# Patient Record
Sex: Female | Born: 1960 | Race: White | Hispanic: No | Marital: Married | Smoking: Never smoker
Health system: Southern US, Community
[De-identification: ages and names within clinical notes are randomized; demographics above are authoritative.]

---

## 1997-12-07 ENCOUNTER — Other Ambulatory Visit: Admission: RE | Admit: 1997-12-07 | Discharge: 1997-12-07 | Payer: Self-pay | Admitting: Obstetrics and Gynecology

## 2002-03-31 ENCOUNTER — Other Ambulatory Visit: Admission: RE | Admit: 2002-03-31 | Discharge: 2002-03-31 | Payer: Self-pay | Admitting: Obstetrics and Gynecology

## 2003-12-30 ENCOUNTER — Other Ambulatory Visit: Admission: RE | Admit: 2003-12-30 | Discharge: 2003-12-30 | Payer: Self-pay | Admitting: Obstetrics and Gynecology

## 2004-01-14 ENCOUNTER — Encounter: Admission: RE | Admit: 2004-01-14 | Discharge: 2004-01-14 | Payer: Self-pay | Admitting: Obstetrics and Gynecology

## 2006-01-04 ENCOUNTER — Encounter: Admission: RE | Admit: 2006-01-04 | Discharge: 2006-01-04 | Payer: Self-pay | Admitting: Obstetrics and Gynecology

## 2010-03-19 ENCOUNTER — Encounter: Payer: Self-pay | Admitting: Obstetrics and Gynecology

## 2013-09-03 NOTE — H&P (Signed)
Krista Oneill is an 53 y.o. female G2P2, husband S/P vasectomy, LMP @ 53 yo. C/O minor vaginal D/C. No vaginal bleeding. Exam in office noted 4 cm pedunculated mass prolapsing from cervix. Subsequent U/S noted thick EM stripe.   Pertinent Gynecological History: Menses: post-menopausal Bleeding: none Contraception: vasectomy DES exposure: denies Blood transfusions: none Sexually transmitted diseases: no past history Previous GYN Procedures: none  Last mammogram: Report pending Date: 2015 Last pap: report pending Date: 2015 OB History: G2, P2   Menstrual History: Menarche age: unknown  No LMP recorded.    No past medical history on file.  No past surgical history on file.  No family history on file.  Social History:  has no tobacco, alcohol, and drug history on file.  Allergies: Allergies not on file  No prescriptions prior to admission    Review of Systems  Constitutional: Negative for fever.    There were no vitals taken for this visit. Physical Exam  Cardiovascular: Normal rate and regular rhythm.   Respiratory: Effort normal and breath sounds normal.  GI: Soft. There is no tenderness.  Genitourinary:  4 cm pedunculated mass prolapsing from cervix Uterus 6 weeks size Adnexa without masses    No results found for this or any previous visit (from the past 24 hour(s)).  No results found.  Assessment/Plan: 53 yo G2P2 postmenopausal patient with prolapsed cervical mass and U/S suggestive of polyp in upper endometrial cavity H/S , D&C, Myosure resectoscope  Krista Oneill,Krista Oneill 09/03/2013, 5:47 PM

## 2013-09-11 ENCOUNTER — Encounter (HOSPITAL_COMMUNITY): Payer: Self-pay | Admitting: *Deleted

## 2013-09-17 ENCOUNTER — Encounter (HOSPITAL_COMMUNITY): Payer: Self-pay | Admitting: *Deleted

## 2013-09-17 ENCOUNTER — Ambulatory Visit (HOSPITAL_COMMUNITY)
Admission: RE | Admit: 2013-09-17 | Discharge: 2013-09-17 | Disposition: A | Discharge status: Home / Self Care | Payer: 59 | Source: Ambulatory Visit | Attending: Obstetrics and Gynecology | Admitting: Obstetrics and Gynecology

## 2013-09-17 ENCOUNTER — Encounter (HOSPITAL_COMMUNITY): Payer: 59 | Admitting: Anesthesiology

## 2013-09-17 ENCOUNTER — Ambulatory Visit (HOSPITAL_COMMUNITY): Payer: 59 | Admitting: Anesthesiology

## 2013-09-17 ENCOUNTER — Encounter (HOSPITAL_COMMUNITY)
Admission: RE | Disposition: A | Discharge status: Home / Self Care | Payer: Self-pay | Source: Ambulatory Visit | Attending: Obstetrics and Gynecology

## 2013-09-17 DIAGNOSIS — Z87891 Personal history of nicotine dependence: Secondary | ICD-10-CM | POA: Insufficient documentation

## 2013-09-17 DIAGNOSIS — N840 Polyp of corpus uteri: Secondary | ICD-10-CM | POA: Insufficient documentation

## 2013-09-17 DIAGNOSIS — Z780 Asymptomatic menopausal state: Secondary | ICD-10-CM | POA: Insufficient documentation

## 2013-09-17 DIAGNOSIS — N841 Polyp of cervix uteri: Secondary | ICD-10-CM | POA: Insufficient documentation

## 2013-09-17 DIAGNOSIS — R928 Other abnormal and inconclusive findings on diagnostic imaging of breast: Secondary | ICD-10-CM

## 2013-09-17 HISTORY — PX: DILATATION & CURETTAGE/HYSTEROSCOPY WITH TRUECLEAR: SHX6353

## 2013-09-17 LAB — CBC
HCT: 40.6 % (ref 36.0–46.0)
Hemoglobin: 13.6 g/dL (ref 12.0–15.0)
MCH: 28.2 pg (ref 26.0–34.0)
MCHC: 33.5 g/dL (ref 30.0–36.0)
MCV: 84.2 fL (ref 78.0–100.0)
Platelets: 302 10*3/uL (ref 150–400)
RBC: 4.82 MIL/uL (ref 3.87–5.11)
RDW: 13.3 % (ref 11.5–15.5)
WBC: 8.4 10*3/uL (ref 4.0–10.5)

## 2013-09-17 SURGERY — DILATATION & CURETTAGE/HYSTEROSCOPY WITH TRUCLEAR
Anesthesia: General | Site: Vagina

## 2013-09-17 MED ORDER — FENTANYL CITRATE 0.05 MG/ML IJ SOLN: 25.0000 ug | INTRAMUSCULAR | Status: DC | PRN

## 2013-09-17 MED ORDER — ONDANSETRON HCL 4 MG/2ML IJ SOLN
INTRAMUSCULAR | Status: AC
  Filled 2013-09-17: qty 2

## 2013-09-17 MED ORDER — LIDOCAINE HCL 1 % IJ SOLN
INTRAMUSCULAR | Status: AC
  Filled 2013-09-17: qty 20

## 2013-09-17 MED ORDER — DEXAMETHASONE SODIUM PHOSPHATE 10 MG/ML IJ SOLN
INTRAMUSCULAR | Status: AC
  Filled 2013-09-17: qty 1

## 2013-09-17 MED ORDER — SILVER NITRATE-POT NITRATE 75-25 % EX MISC
CUTANEOUS | Status: AC
  Filled 2013-09-17: qty 1

## 2013-09-17 MED ORDER — MEPERIDINE HCL 25 MG/ML IJ SOLN
6.2500 mg | INTRAMUSCULAR | Status: DC | PRN
Start: 2013-09-17 — End: 2013-09-17

## 2013-09-17 MED ORDER — LACTATED RINGERS IV SOLN
INTRAVENOUS | Status: DC
  Administered 2013-09-17 (×3): via INTRAVENOUS

## 2013-09-17 MED ORDER — ONDANSETRON HCL 4 MG/2ML IJ SOLN
INTRAMUSCULAR | Status: DC | PRN
  Administered 2013-09-17: 4 mg via INTRAVENOUS

## 2013-09-17 MED ORDER — PROPOFOL 10 MG/ML IV BOLUS
INTRAVENOUS | Status: DC | PRN
  Administered 2013-09-17: 150 mg via INTRAVENOUS

## 2013-09-17 MED ORDER — KETOROLAC TROMETHAMINE 30 MG/ML IJ SOLN: 15.0000 mg | Freq: Once | INTRAMUSCULAR | Status: DC | PRN

## 2013-09-17 MED ORDER — LIDOCAINE HCL (CARDIAC) 20 MG/ML IV SOLN
INTRAVENOUS | Status: AC
  Filled 2013-09-17: qty 5

## 2013-09-17 MED ORDER — CEFAZOLIN SODIUM-DEXTROSE 2-3 GM-% IV SOLR: 2.0000 g | INTRAVENOUS | Status: DC

## 2013-09-17 MED ORDER — PROPOFOL 10 MG/ML IV EMUL
INTRAVENOUS | Status: AC
  Filled 2013-09-17: qty 20

## 2013-09-17 MED ORDER — LIDOCAINE HCL (CARDIAC) 20 MG/ML IV SOLN
INTRAVENOUS | Status: DC | PRN
  Administered 2013-09-17: 20 mg via INTRAVENOUS

## 2013-09-17 MED ORDER — FENTANYL CITRATE 0.05 MG/ML IJ SOLN
INTRAMUSCULAR | Status: AC
  Filled 2013-09-17: qty 2

## 2013-09-17 MED ORDER — DEXAMETHASONE SODIUM PHOSPHATE 10 MG/ML IJ SOLN
INTRAMUSCULAR | Status: DC | PRN
  Administered 2013-09-17: 4 mg via INTRAVENOUS

## 2013-09-17 MED ORDER — KETOROLAC TROMETHAMINE 30 MG/ML IJ SOLN
INTRAMUSCULAR | Status: DC | PRN
  Administered 2013-09-17: 30 mg via INTRAVENOUS

## 2013-09-17 MED ORDER — MIDAZOLAM HCL 2 MG/2ML IJ SOLN
INTRAMUSCULAR | Status: AC
  Filled 2013-09-17: qty 2

## 2013-09-17 MED ORDER — SODIUM CHLORIDE 0.9 % IR SOLN
Status: DC | PRN
  Administered 2013-09-17: 3000 mL

## 2013-09-17 MED ORDER — METOCLOPRAMIDE HCL 5 MG/ML IJ SOLN: 10.0000 mg | Freq: Once | INTRAMUSCULAR | Status: DC | PRN

## 2013-09-17 MED ORDER — MIDAZOLAM HCL 2 MG/2ML IJ SOLN
INTRAMUSCULAR | Status: DC | PRN
  Administered 2013-09-17: 2 mg via INTRAVENOUS

## 2013-09-17 MED ORDER — FENTANYL CITRATE 0.05 MG/ML IJ SOLN
INTRAMUSCULAR | Status: DC | PRN
  Administered 2013-09-17 (×2): 50 ug via INTRAVENOUS

## 2013-09-17 MED ORDER — LIDOCAINE HCL 1 % IJ SOLN
INTRAMUSCULAR | Status: DC | PRN
  Administered 2013-09-17: 20 mL

## 2013-09-17 MED ORDER — CEFAZOLIN SODIUM-DEXTROSE 2-3 GM-% IV SOLR
INTRAVENOUS | Status: AC
  Administered 2013-09-17: 2 g via INTRAVENOUS
  Filled 2013-09-17: qty 50

## 2013-09-17 MED ORDER — KETOROLAC TROMETHAMINE 30 MG/ML IJ SOLN
INTRAMUSCULAR | Status: AC
  Filled 2013-09-17: qty 1

## 2013-09-17 SURGICAL SUPPLY — 14 items
BLADE INCISOR TRUC PLUS 2.9 (ABLATOR) IMPLANT
CANISTERS HI-FLOW 3000CC (CANNISTER) ×6 IMPLANT
CATH ROBINSON RED A/P 16FR (CATHETERS) ×3 IMPLANT
CLOTH BEACON ORANGE TIMEOUT ST (SAFETY) ×3 IMPLANT
CONTAINER PREFILL 10% NBF 60ML (FORM) ×6 IMPLANT
DRAPE HYSTEROSCOPY (DRAPE) ×3 IMPLANT
GLOVE BIO SURGEON STRL SZ7 (GLOVE) ×6 IMPLANT
GOWN STRL REUS W/TWL LRG LVL3 (GOWN DISPOSABLE) ×6 IMPLANT
INCISOR TRUC PLUS BLADE 2.9 (ABLATOR) ×3
KIT HYSTEROSCOPY TRUCLEAR (ABLATOR) IMPLANT
MORCELLATOR RECIP TRUCLEAR 4.0 (ABLATOR) IMPLANT
PACK VAGINAL MINOR WOMEN LF (CUSTOM PROCEDURE TRAY) ×3 IMPLANT
PAD OB MATERNITY 4.3X12.25 (PERSONAL CARE ITEMS) ×3 IMPLANT
TOWEL OR 17X24 6PK STRL BLUE (TOWEL DISPOSABLE) ×6 IMPLANT

## 2013-09-17 NOTE — Progress Notes (Signed)
No changes to H&P per patient history Reviewed procedure-H/S, D&C, Trueclear All questions answered

## 2013-09-17 NOTE — Anesthesia Postprocedure Evaluation (Signed)
  Anesthesia Post-op Note  Anesthesia Post Note  Patient: Krista Oneill  Procedure(s) Performed: Procedure(s) (LRB): DILATATION & CURETTAGE/HYSTEROSCOPY WITH TRUCLEAR (N/A)  Anesthesia type: General  Patient location: PACU  Post pain: Pain level controlled  Post assessment: Post-op Vital signs reviewed  Last Vitals:  Filed Vitals:   09/17/13 1045  BP: 119/61  Pulse: 58  Temp:   Resp: 12    Post vital signs: Reviewed  Level of consciousness: sedated  Complications: No apparent anesthesia complications

## 2013-09-17 NOTE — Brief Op Note (Signed)
09/17/2013  9:41 AM  PATIENT:  Krista Oneill  53 y.o. female  PRE-OPERATIVE DIAGNOSIS:  endocervical polyp  POST-OPERATIVE DIAGNOSIS:  endocervical polyp  PROCEDURE:  Procedure(s): DILATATION & CURETTAGE/HYSTEROSCOPY WITH TRUCLEAR (N/A)  SURGEON:  Surgeon(s) and Role:    * Leslie AndreaJames E Mayer Vondrak II, MD - Primary  PHYSICIAN ASSISTANT:   ASSISTANTS: none   ANESTHESIA:   general  EBL:  Total I/O In: -  Out: 65 [Urine:40; Blood:25]  BLOOD ADMINISTERED:none  DRAINS: none   LOCAL MEDICATIONS USED:  LIDOCAINE  and Amount: 20 ml  SPECIMEN:  Source of Specimen:  endocervical polyp, endometrial resection, endometrial currettings  DISPOSITION OF SPECIMEN:  PATHOLOGY  COUNTS:  YES  TOURNIQUET:  * No tourniquets in log *  DICTATION: .Other Dictation: Dictation Number [id number]  PLAN OF CARE: Discharge to home after PACU  PATIENT DISPOSITION:  PACU - hemodynamically stable.   Delay start of Pharmacological VTE agent (>24hrs) due to surgical blood loss or risk of bleeding: not applicable

## 2013-09-17 NOTE — Anesthesia Preprocedure Evaluation (Signed)
Anesthesia Evaluation  Patient identified by MRN, date of birth, ID band Patient awake    Reviewed: Allergy & Precautions, H&P , NPO status , Patient's Chart, lab work & pertinent test results, reviewed documented beta blocker date and time   History of Anesthesia Complications Negative for: history of anesthetic complications  Airway Mallampati: III TM Distance: >3 FB Neck ROM: full    Dental  (+) Teeth Intact   Pulmonary neg pulmonary ROS,  breath sounds clear to auscultation  Pulmonary exam normal       Cardiovascular Exercise Tolerance: Good negative cardio ROS  Rhythm:regular Rate:Normal     Neuro/Psych negative neurological ROS  negative psych ROS   GI/Hepatic negative GI ROS, Neg liver ROS,   Endo/Other  negative endocrine ROS  Renal/GU negative Renal ROS  Female GU complaint     Musculoskeletal   Abdominal   Peds  Hematology negative hematology ROS (+)   Anesthesia Other Findings   Reproductive/Obstetrics negative OB ROS                           Anesthesia Physical Anesthesia Plan  ASA: II  Anesthesia Plan: General LMA   Post-op Pain Management:    Induction:   Airway Management Planned:   Additional Equipment:   Intra-op Plan:   Post-operative Plan:   Informed Consent: I have reviewed the patients History and Physical, chart, labs and discussed the procedure including the risks, benefits and alternatives for the proposed anesthesia with the patient or authorized representative who has indicated his/her understanding and acceptance.   Dental Advisory Given  Plan Discussed with: CRNA and Surgeon  Anesthesia Plan Comments:         Anesthesia Quick Evaluation

## 2013-09-17 NOTE — Transfer of Care (Signed)
Immediate Anesthesia Transfer of Care Note  Patient: Krista Oneill  Procedure(s) Performed: Procedure(s): DILATATION & CURETTAGE/HYSTEROSCOPY WITH TRUCLEAR (N/A)  Patient Location: PACU  Anesthesia Type:General  Level of Consciousness: awake, alert , oriented and patient cooperative  Airway & Oxygen Therapy: Patient Spontanous Breathing and Patient connected to nasal cannula oxygen  Post-op Assessment: Report given to PACU RN and Post -op Vital signs reviewed and stable  Post vital signs: Reviewed and stable  Complications: No apparent anesthesia complications

## 2013-09-17 NOTE — Discharge Instructions (Signed)
No vaginal endtry                                                          DISCHARGE INSTRUCTIONS: D&C  The following instructions have been prepared to help you care for yourself upon your return home.   MAY TAKE IBUPROFEN (MOTRIN, ADVIL) OR ALEVE AFTER 3:30 PM FOR CRAMPS!!!  Personal hygiene:  Use sanitary pads for vaginal drainage, not tampons.  Shower the day after your procedure.  NO tub baths, pools or Jacuzzis for 2-3 weeks.  Wipe front to back after using the bathroom.  Activity and limitations:  Do NOT drive or operate any equipment for 24 hours. The effects of anesthesia are still present and drowsiness may result.  Do NOT rest in bed all day.  Walking is encouraged.  Walk up and down stairs slowly.  You may resume your normal activity in one to two days or as indicated by your physician.  Sexual activity: NO intercourse for at least 2 weeks after the procedure, or as indicated by your physician.  Diet: Eat a light meal as desired this evening. You may resume your usual diet tomorrow.  Return to work: You may resume your work activities in one to two days or as indicated by your doctor.  What to expect after your surgery: Expect to have vaginal bleeding/discharge for 2-3 days and spotting for up to 10 days. It is not unusual to have soreness for up to 1-2 weeks. You may have a slight burning sensation when you urinate for the first day. Mild cramps may continue for a couple of days. You may have a regular period in 2-6 weeks.  Call your doctor for any of the following:  Excessive vaginal bleeding, saturating and changing one pad every hour.  Inability to urinate 6 hours after discharge from hospital.  Pain not relieved by pain medication.  Fever of 100.4 F or greater.  Unusual vaginal discharge or odor.   Call for an appointment:    Patients signature: ______________________  Nurses signature ________________________  Support person's  signature_______________________

## 2013-09-17 NOTE — Op Note (Signed)
[name], Krista Oneill               ACCOUNT NO.:  [id number]  MEDICAL RECORD NO.:  [id number]  LOCATION:  [facility]                          FACILITY:  [facility]  PHYSICIAN:  Guy Sandifer. Henderson Cloud, M.D. DATE OF BIRTH:  1960-03-03  DATE OF PROCEDURE:  09/17/2013 DATE OF DISCHARGE:                              OPERATIVE REPORT   PREOPERATIVE DIAGNOSIS:  Endocervical polyp.  POSTOPERATIVE DIAGNOSES: 1. Endocervical polyp. 2. Endometrial polyp.  PROCEDURE:  Hysteroscopy with resection of endometrial polyps, dilation and curettage.  SURGEON:  Guy Sandifer. Henderson Cloud, M.D.  ANESTHESIA:  General with mask.  SPECIMENS: 1. Endocervical polyp. 2. Endometrial resection. 3. Endometrial curettings.  All to Pathology.  I AND O:  With a 40 mL deficit of the distending media.  ESTIMATED BLOOD LOSS:  Less than 50 mL.  INDICATIONS AND CONSENT:  This patient is a 53 year old married white female, who is noted to have approximately a 2 x 4 cm polypoid structure protruding from the cervix during an office examination.  Subsequent ultrasound is also suggestive of endometrial masses.  Hysteroscopy, D and C, resectoscope has been discussed preoperatively.  Potential risks and complications were discussed preoperatively including, but not limited to, infection, organ damage, bleeding requiring transfusion of blood products with HIV and hepatitis acquisition, uterine perforation, DVT/PE, pneumonia, laparotomy, pelvic pain, painful intercourse, recurrent abnormal bleeding, or endometrial polyps.  All questions have been answered and consent is signed on the chart.  FINDINGS:  There is a 2 x 4 cm polyp protruding from the cervix.  In the endometrial cavity,  both fallopian tube and ostia are identified. There is a pedunculated 2 cm polypoid structure on the lower right anterior canal as well as a 1 cm broad-based polypoid structure on the right lower endometrial cavity.  PROCEDURE:  The patient was taken to the  operating room, where she was identified, placed in dorsal supine position, and general anesthesia was induced.  She was placed in a dorsal lithotomy position.  She was prepped, bladder straight catheterized, and draped in a sterile fashion. Time-out was taken.  Pelvic exam reveals uterus to be anteverted, about 8 week size, mobile.  No adnexal masses were noted.  Bivalve speculum was placed in the vagina.  The endocervical polyp was then grasped with ring clamps and is removed with a simple twisting motion without difficulty.  The anterior cervical lip was then injected with 1% plain lidocaine and grasped with single-tooth tenaculum.  Paracervical block with the same solution was placed at 2, 4, 5, 7, 8, and 10 o'clock positions with approximately 20 mL of the same solution.  The TRUCLEAR hysteroscope was placed in the endocervical canal and advanced under direct visualization using distending media.  The above findings were noted.  TRUCLEAR resectoscope was then used to resect the noted polyps. This was removed and sharp curettage was carried out.  Reinspection with the hysteroscope reveals the cavity to be clean.  Instruments were removed.  Good hemostasis was noted.  All counts were correct.  The patient was awakened and taken to the recovery room in stable condition.     Guy Sandifer Henderson Cloud, M.D.     JET/MEDQ  D:  09/17/2013  T:  09/17/2013  Job:  [id number]

## 2013-09-18 ENCOUNTER — Encounter (HOSPITAL_COMMUNITY): Payer: Self-pay | Admitting: Obstetrics and Gynecology

## 2013-09-18 ENCOUNTER — Other Ambulatory Visit: Payer: Self-pay | Admitting: Obstetrics and Gynecology

## 2013-09-18 DIAGNOSIS — R928 Other abnormal and inconclusive findings on diagnostic imaging of breast: Secondary | ICD-10-CM

## 2013-09-24 ENCOUNTER — Other Ambulatory Visit: Payer: Self-pay | Admitting: Obstetrics and Gynecology

## 2013-09-24 ENCOUNTER — Ambulatory Visit
Admission: RE | Admit: 2013-09-24 | Discharge: 2013-09-24 | Disposition: A | Discharge status: Home / Self Care | Payer: 59 | Source: Ambulatory Visit | Attending: Obstetrics and Gynecology | Admitting: Obstetrics and Gynecology

## 2013-09-24 DIAGNOSIS — R928 Other abnormal and inconclusive findings on diagnostic imaging of breast: Secondary | ICD-10-CM

## 2013-10-07 ENCOUNTER — Ambulatory Visit
Admission: RE | Admit: 2013-10-07 | Discharge: 2013-10-07 | Disposition: A | Discharge status: Home / Self Care | Payer: 59 | Source: Ambulatory Visit | Attending: Obstetrics and Gynecology | Admitting: Obstetrics and Gynecology

## 2013-10-07 DIAGNOSIS — R928 Other abnormal and inconclusive findings on diagnostic imaging of breast: Secondary | ICD-10-CM

## 2013-10-07 HISTORY — PX: BREAST BIOPSY: SHX20

## 2013-10-15 ENCOUNTER — Other Ambulatory Visit: Payer: Self-pay | Admitting: Dermatology

## 2015-12-01 ENCOUNTER — Other Ambulatory Visit: Payer: Self-pay | Admitting: Obstetrics and Gynecology

## 2015-12-01 DIAGNOSIS — R928 Other abnormal and inconclusive findings on diagnostic imaging of breast: Secondary | ICD-10-CM

## 2015-12-07 ENCOUNTER — Ambulatory Visit
Admission: RE | Admit: 2015-12-07 | Discharge: 2015-12-07 | Disposition: A | Discharge status: Home / Self Care | Payer: 59 | Source: Ambulatory Visit | Attending: Obstetrics and Gynecology | Admitting: Obstetrics and Gynecology

## 2015-12-07 DIAGNOSIS — R928 Other abnormal and inconclusive findings on diagnostic imaging of breast: Secondary | ICD-10-CM

## 2021-06-16 ENCOUNTER — Other Ambulatory Visit: Payer: Self-pay | Admitting: Obstetrics and Gynecology

## 2021-06-16 ENCOUNTER — Ambulatory Visit
Admission: RE | Admit: 2021-06-16 | Discharge: 2021-06-16 | Disposition: A | Discharge status: Home / Self Care | Payer: 59 | Source: Ambulatory Visit | Attending: Obstetrics and Gynecology | Admitting: Obstetrics and Gynecology

## 2021-06-16 ENCOUNTER — Ambulatory Visit
Admission: RE | Admit: 2021-06-16 | Discharge: 2021-06-16 | Disposition: A | Discharge status: Home / Self Care | Payer: BC Managed Care – PPO | Source: Ambulatory Visit | Attending: Obstetrics and Gynecology | Admitting: Obstetrics and Gynecology

## 2021-06-16 DIAGNOSIS — R928 Other abnormal and inconclusive findings on diagnostic imaging of breast: Secondary | ICD-10-CM

## 2021-06-16 DIAGNOSIS — N6320 Unspecified lump in the left breast, unspecified quadrant: Secondary | ICD-10-CM

## 2021-06-24 ENCOUNTER — Ambulatory Visit
Admission: RE | Admit: 2021-06-24 | Discharge: 2021-06-24 | Disposition: A | Discharge status: Home / Self Care | Payer: BC Managed Care – PPO | Source: Ambulatory Visit | Attending: Obstetrics and Gynecology | Admitting: Obstetrics and Gynecology

## 2021-06-24 DIAGNOSIS — N6320 Unspecified lump in the left breast, unspecified quadrant: Secondary | ICD-10-CM

## 2022-04-05 DIAGNOSIS — Z79811 Long term (current) use of aromatase inhibitors: Secondary | ICD-10-CM | POA: Diagnosis not present

## 2022-04-05 DIAGNOSIS — Z170 Estrogen receptor positive status [ER+]: Secondary | ICD-10-CM | POA: Diagnosis not present

## 2022-04-05 DIAGNOSIS — Z923 Personal history of irradiation: Secondary | ICD-10-CM | POA: Diagnosis not present

## 2022-04-05 DIAGNOSIS — C50412 Malignant neoplasm of upper-outer quadrant of left female breast: Secondary | ICD-10-CM | POA: Diagnosis not present

## 2022-04-05 DIAGNOSIS — Z79899 Other long term (current) drug therapy: Secondary | ICD-10-CM | POA: Diagnosis not present

## 2022-04-05 DIAGNOSIS — E559 Vitamin D deficiency, unspecified: Secondary | ICD-10-CM | POA: Diagnosis not present

## 2022-04-05 DIAGNOSIS — Z8049 Family history of malignant neoplasm of other genital organs: Secondary | ICD-10-CM | POA: Diagnosis not present

## 2022-06-15 DIAGNOSIS — L910 Hypertrophic scar: Secondary | ICD-10-CM | POA: Diagnosis not present

## 2022-06-15 DIAGNOSIS — Z86018 Personal history of other benign neoplasm: Secondary | ICD-10-CM | POA: Diagnosis not present

## 2022-06-15 DIAGNOSIS — D2362 Other benign neoplasm of skin of left upper limb, including shoulder: Secondary | ICD-10-CM | POA: Diagnosis not present

## 2022-06-15 DIAGNOSIS — D229 Melanocytic nevi, unspecified: Secondary | ICD-10-CM | POA: Diagnosis not present

## 2022-06-15 DIAGNOSIS — D2371 Other benign neoplasm of skin of right lower limb, including hip: Secondary | ICD-10-CM | POA: Diagnosis not present

## 2022-06-15 DIAGNOSIS — D2271 Melanocytic nevi of right lower limb, including hip: Secondary | ICD-10-CM | POA: Diagnosis not present

## 2022-06-15 DIAGNOSIS — D225 Melanocytic nevi of trunk: Secondary | ICD-10-CM | POA: Diagnosis not present

## 2022-06-15 DIAGNOSIS — D2261 Melanocytic nevi of right upper limb, including shoulder: Secondary | ICD-10-CM | POA: Diagnosis not present

## 2022-06-15 DIAGNOSIS — L578 Other skin changes due to chronic exposure to nonionizing radiation: Secondary | ICD-10-CM | POA: Diagnosis not present

## 2022-06-15 DIAGNOSIS — L821 Other seborrheic keratosis: Secondary | ICD-10-CM | POA: Diagnosis not present

## 2022-06-15 DIAGNOSIS — W908XXS Exposure to other nonionizing radiation, sequela: Secondary | ICD-10-CM | POA: Diagnosis not present

## 2022-06-15 DIAGNOSIS — D2262 Melanocytic nevi of left upper limb, including shoulder: Secondary | ICD-10-CM | POA: Diagnosis not present

## 2022-06-16 DIAGNOSIS — Z6831 Body mass index (BMI) 31.0-31.9, adult: Secondary | ICD-10-CM | POA: Diagnosis not present

## 2022-06-16 DIAGNOSIS — N952 Postmenopausal atrophic vaginitis: Secondary | ICD-10-CM | POA: Diagnosis not present

## 2022-06-16 DIAGNOSIS — Z853 Personal history of malignant neoplasm of breast: Secondary | ICD-10-CM | POA: Diagnosis not present

## 2022-06-16 DIAGNOSIS — Z01419 Encounter for gynecological examination (general) (routine) without abnormal findings: Secondary | ICD-10-CM | POA: Diagnosis not present

## 2022-06-20 DIAGNOSIS — E669 Obesity, unspecified: Secondary | ICD-10-CM | POA: Diagnosis not present

## 2022-06-20 DIAGNOSIS — Z23 Encounter for immunization: Secondary | ICD-10-CM | POA: Diagnosis not present

## 2022-06-20 DIAGNOSIS — Z7184 Encounter for health counseling related to travel: Secondary | ICD-10-CM | POA: Diagnosis not present

## 2022-08-02 DIAGNOSIS — Z79811 Long term (current) use of aromatase inhibitors: Secondary | ICD-10-CM | POA: Diagnosis not present

## 2022-08-02 DIAGNOSIS — Z170 Estrogen receptor positive status [ER+]: Secondary | ICD-10-CM | POA: Diagnosis not present

## 2022-08-02 DIAGNOSIS — C50312 Malignant neoplasm of lower-inner quadrant of left female breast: Secondary | ICD-10-CM | POA: Diagnosis not present

## 2022-08-02 DIAGNOSIS — Z923 Personal history of irradiation: Secondary | ICD-10-CM | POA: Diagnosis not present

## 2022-08-02 DIAGNOSIS — C50912 Malignant neoplasm of unspecified site of left female breast: Secondary | ICD-10-CM | POA: Diagnosis not present

## 2022-09-06 DIAGNOSIS — C50912 Malignant neoplasm of unspecified site of left female breast: Secondary | ICD-10-CM | POA: Diagnosis not present

## 2022-09-06 DIAGNOSIS — C50412 Malignant neoplasm of upper-outer quadrant of left female breast: Secondary | ICD-10-CM | POA: Diagnosis not present

## 2022-09-06 DIAGNOSIS — R92323 Mammographic fibroglandular density, bilateral breasts: Secondary | ICD-10-CM | POA: Diagnosis not present

## 2022-09-06 DIAGNOSIS — Z170 Estrogen receptor positive status [ER+]: Secondary | ICD-10-CM | POA: Diagnosis not present

## 2022-10-25 DIAGNOSIS — C50912 Malignant neoplasm of unspecified site of left female breast: Secondary | ICD-10-CM | POA: Diagnosis not present

## 2022-10-25 DIAGNOSIS — Z9189 Other specified personal risk factors, not elsewhere classified: Secondary | ICD-10-CM | POA: Diagnosis not present

## 2022-10-25 DIAGNOSIS — Z79811 Long term (current) use of aromatase inhibitors: Secondary | ICD-10-CM | POA: Diagnosis not present

## 2022-11-13 DIAGNOSIS — N9089 Other specified noninflammatory disorders of vulva and perineum: Secondary | ICD-10-CM | POA: Diagnosis not present

## 2022-11-29 DIAGNOSIS — L720 Epidermal cyst: Secondary | ICD-10-CM | POA: Diagnosis not present

## 2022-11-29 DIAGNOSIS — L608 Other nail disorders: Secondary | ICD-10-CM | POA: Diagnosis not present

## 2023-02-09 DIAGNOSIS — Z1731 Human epidermal growth factor receptor 2 positive status: Secondary | ICD-10-CM | POA: Diagnosis not present

## 2023-02-09 DIAGNOSIS — L905 Scar conditions and fibrosis of skin: Secondary | ICD-10-CM | POA: Diagnosis not present

## 2023-02-09 DIAGNOSIS — Z170 Estrogen receptor positive status [ER+]: Secondary | ICD-10-CM | POA: Diagnosis not present

## 2023-02-09 DIAGNOSIS — Z79811 Long term (current) use of aromatase inhibitors: Secondary | ICD-10-CM | POA: Diagnosis not present

## 2023-02-09 DIAGNOSIS — Z1721 Progesterone receptor positive status: Secondary | ICD-10-CM | POA: Diagnosis not present

## 2023-02-09 DIAGNOSIS — Z9889 Other specified postprocedural states: Secondary | ICD-10-CM | POA: Diagnosis not present

## 2023-02-09 DIAGNOSIS — C50912 Malignant neoplasm of unspecified site of left female breast: Secondary | ICD-10-CM | POA: Diagnosis not present

## 2023-02-09 DIAGNOSIS — Z923 Personal history of irradiation: Secondary | ICD-10-CM | POA: Diagnosis not present

## 2024-04-19 IMAGING — MG MM BREAST LOCALIZATION CLIP
4 series · 4 of 12 positions shown · non-contrast
Comparison: Previous exam(s).

CLINICAL DATA: Status post ultrasound-guided biopsy left breast
mass 2 o'clock position

EXAM:
3D DIAGNOSTIC LEFT MAMMOGRAM POST ULTRASOUND BIOPSY

[L ML synth-2D]
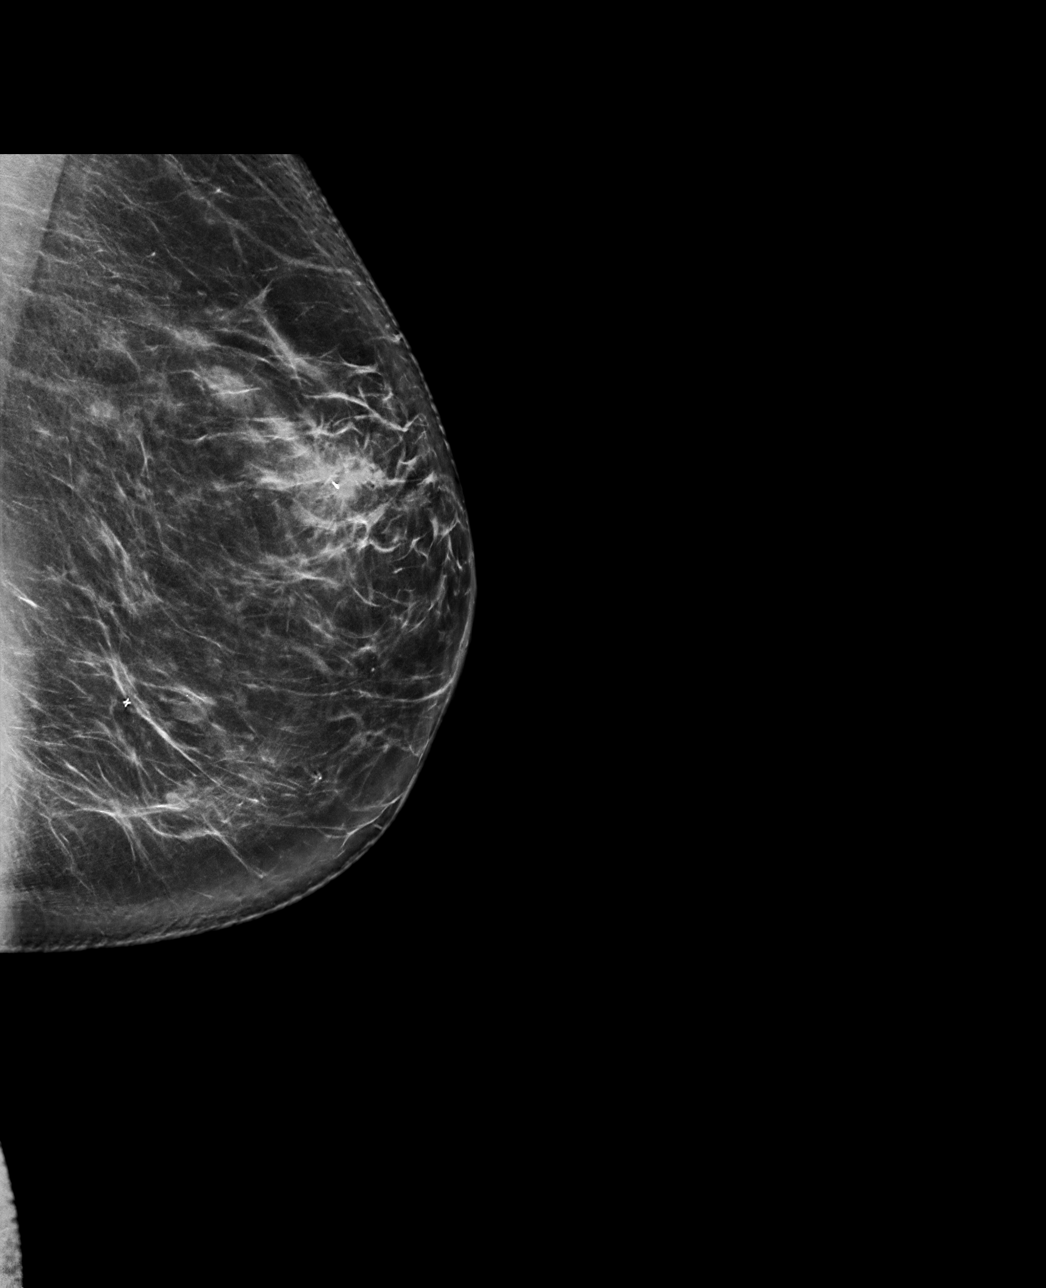

[L CC synth-2D]
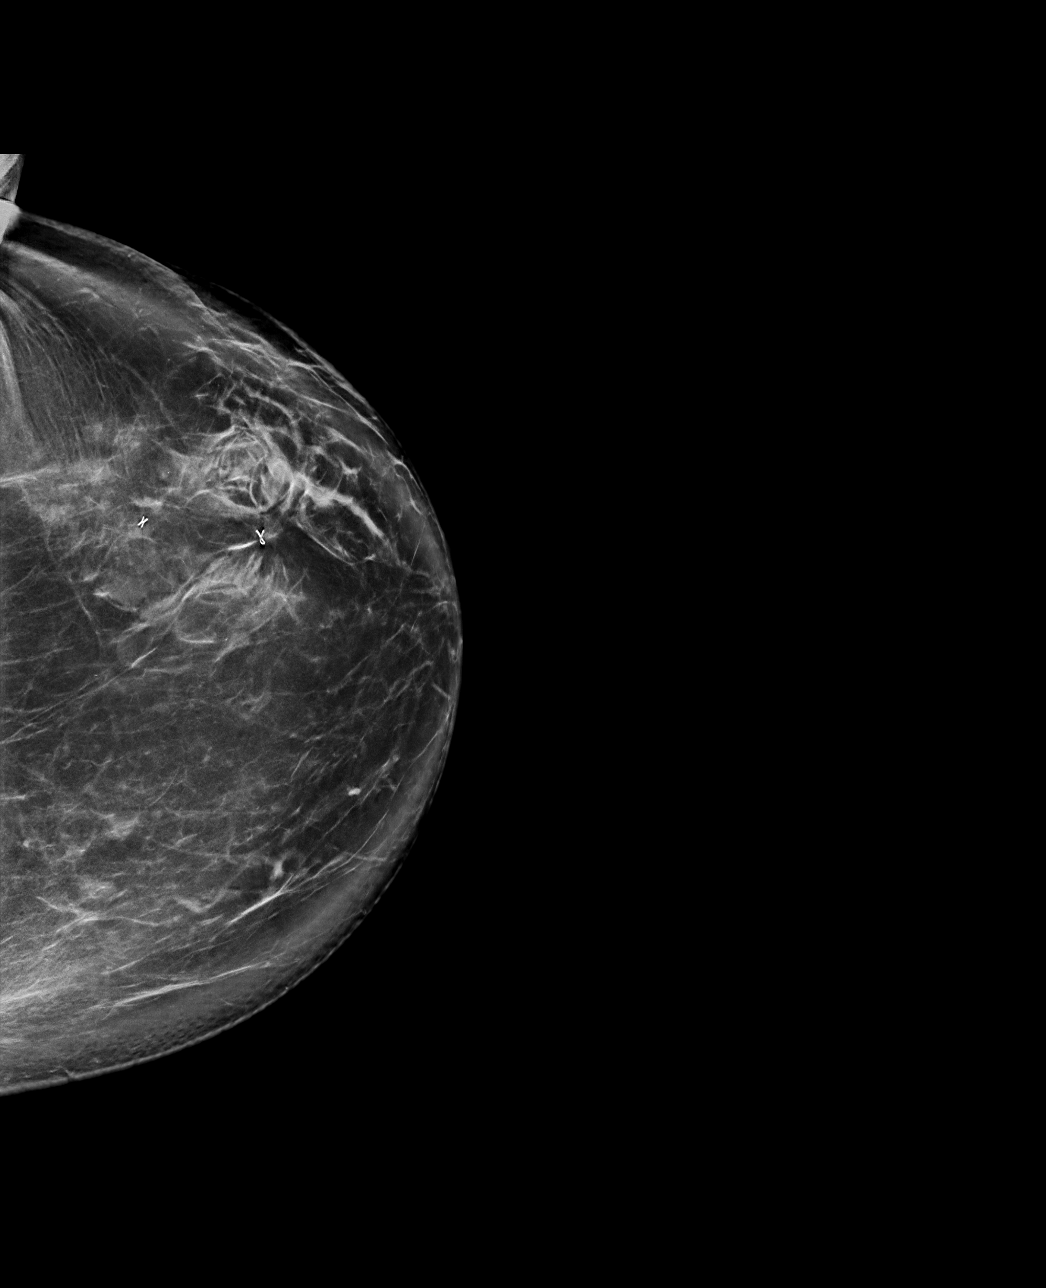

[L CC tomo · tomo slice 50/99.0]
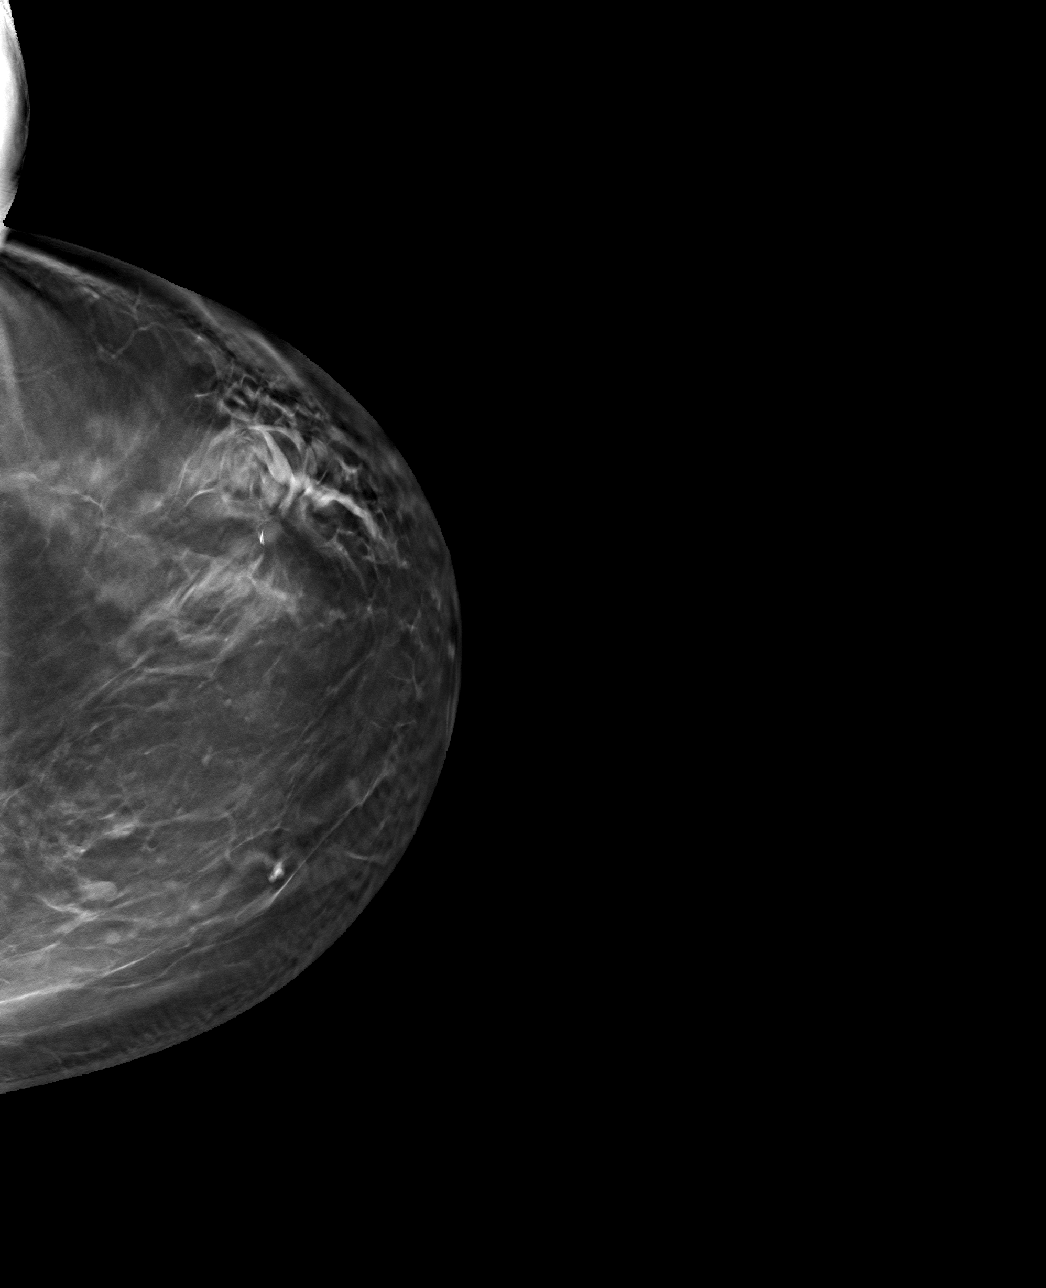

[L ML tomo · tomo slice 43/86.0]
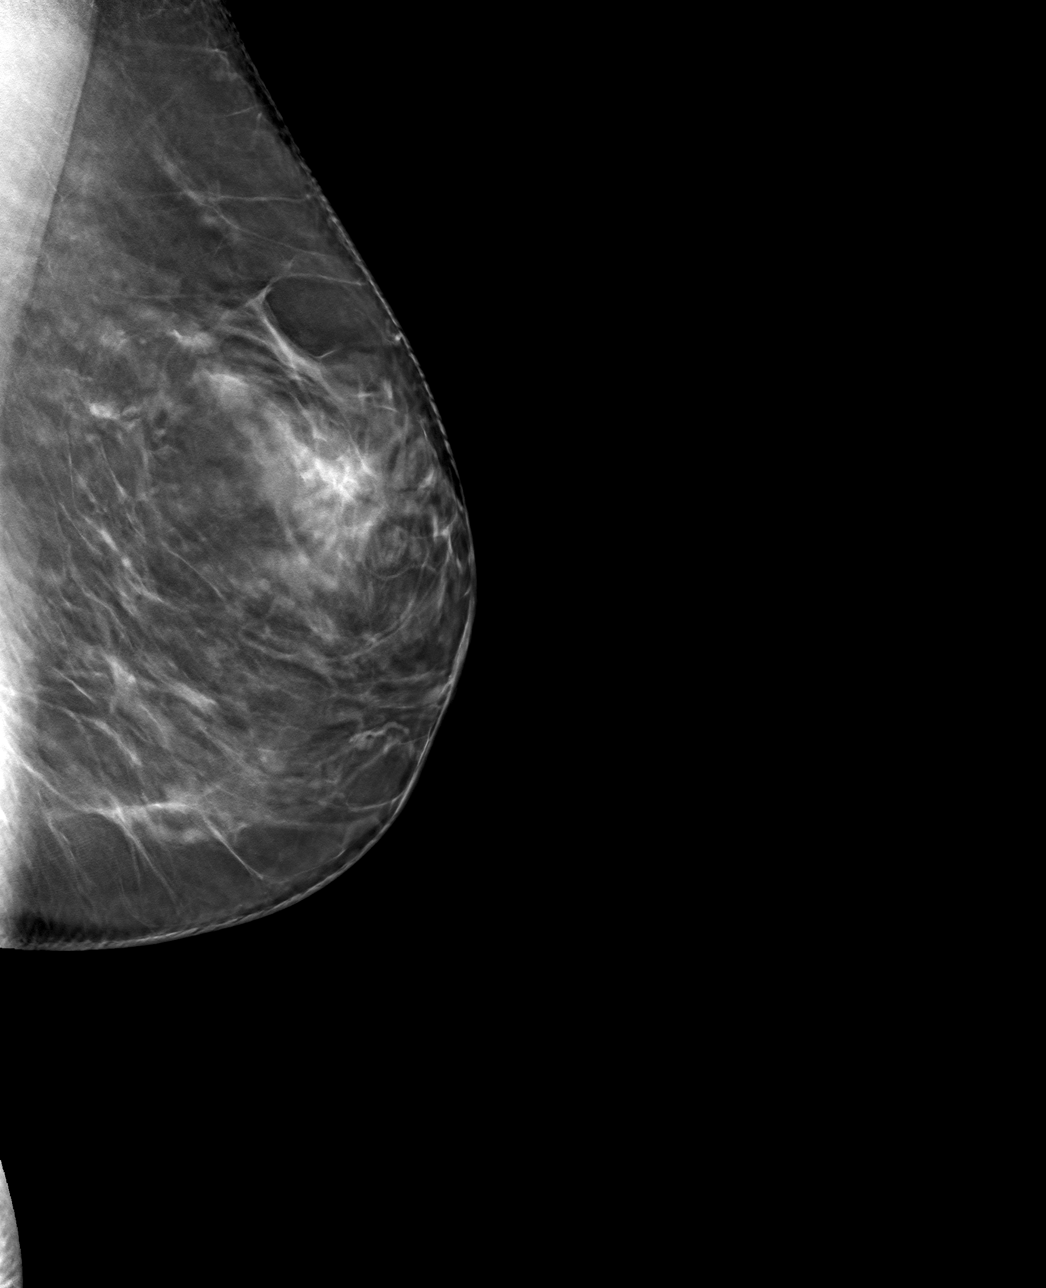

[4 of 12 positions shown; findings below may reference images not displayed]

FINDINGS: 3D Mammographic images were obtained following ultrasound guided
biopsy of left breast mass 2 o'clock position. The biopsy marking
clip is in expected position at the site of biopsy.
IMPRESSION: Appropriate positioning of the ribbon shaped biopsy marking clip at
the site of biopsy in the upper-outer left breast.

Final Assessment: Post Procedure Mammograms for Marker Placement

## 2024-04-19 IMAGING — US US BREAST BX W LOC DEV 1ST LESION IMG BX SPEC US GUIDE*L*
1 series · 10 of 10 positions shown · non-contrast
Comparison: Previous exam(s).
COMPARISON: Previous exam(s).

Addendum:
CLINICAL DATA: Patient with suspicious left breast mass 2 o'clock
position.

EXAM:
ULTRASOUND GUIDED LEFT BREAST CORE NEEDLE BIOPSY

[Series 1: us breast bx w loc dev 1st lesion img bx spec us g · 0.06mm/px · 10 of 10 slices shown]
[im 1/10]
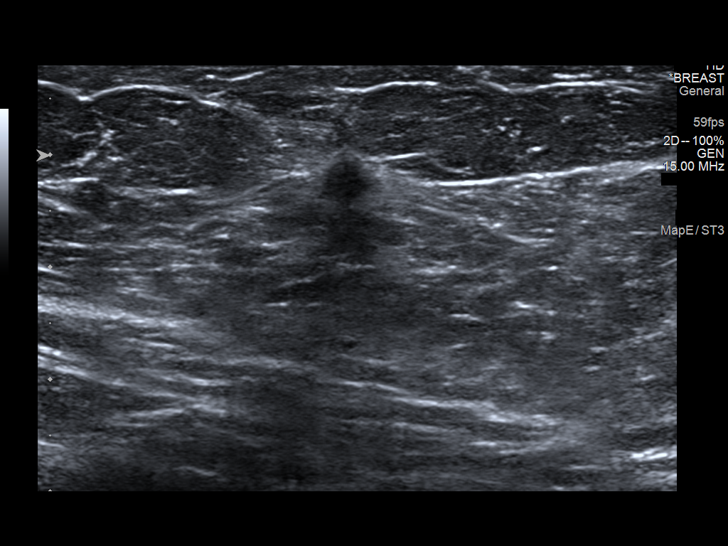
[im 2/10]
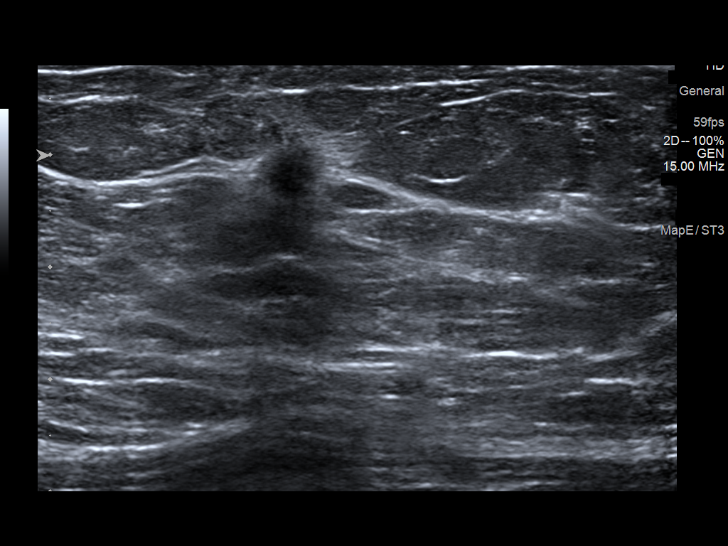
[im 3/10]
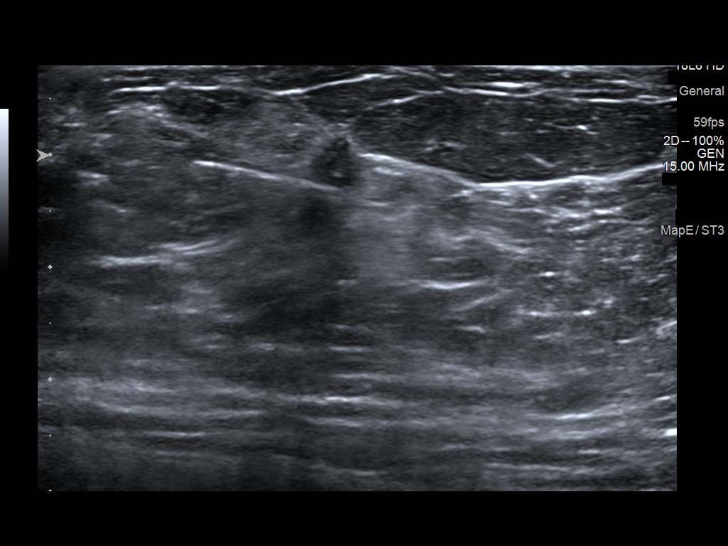
[im 4/10]
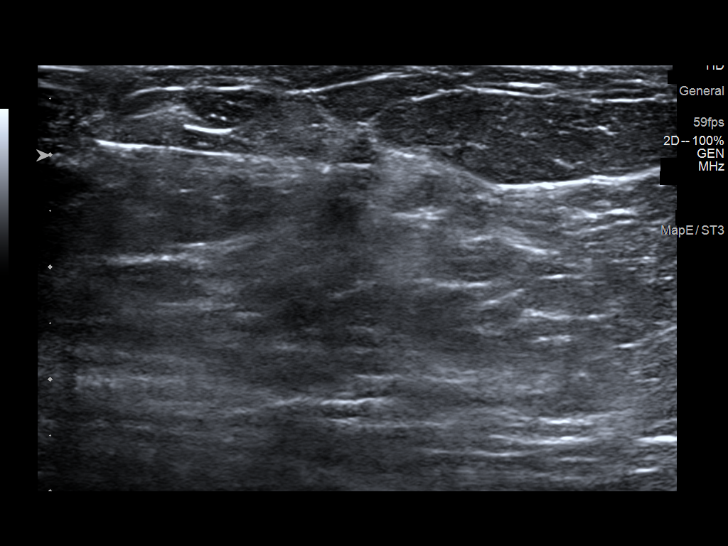
[im 5/10]
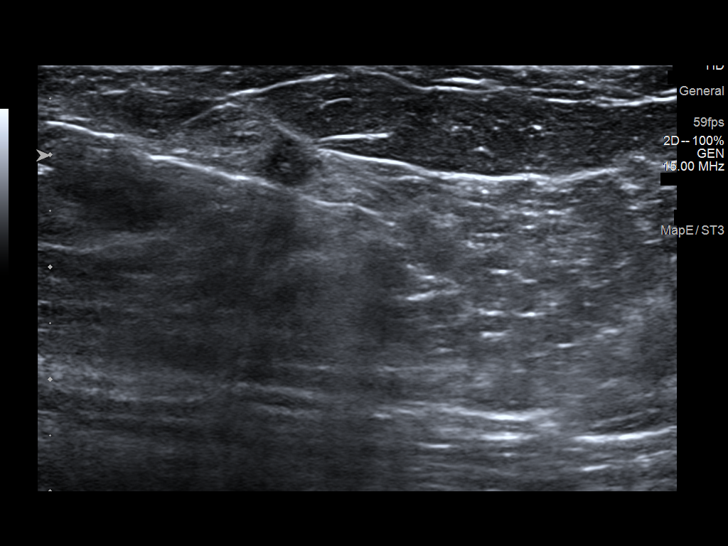
[im 6/10]
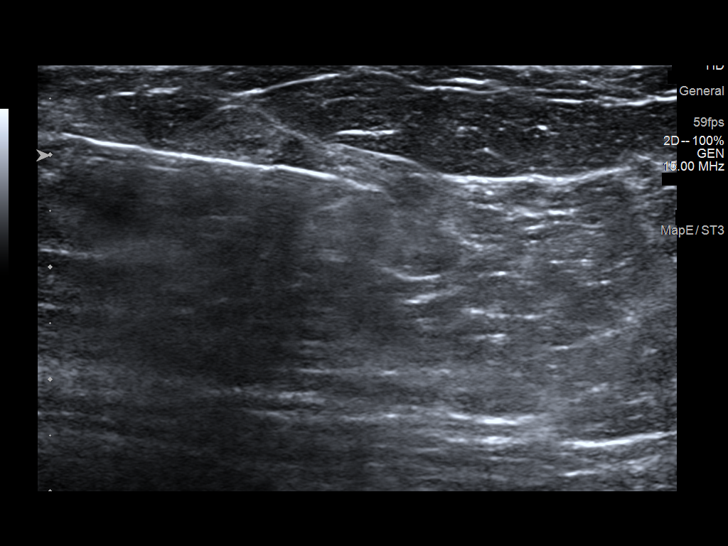
[im 7/10]
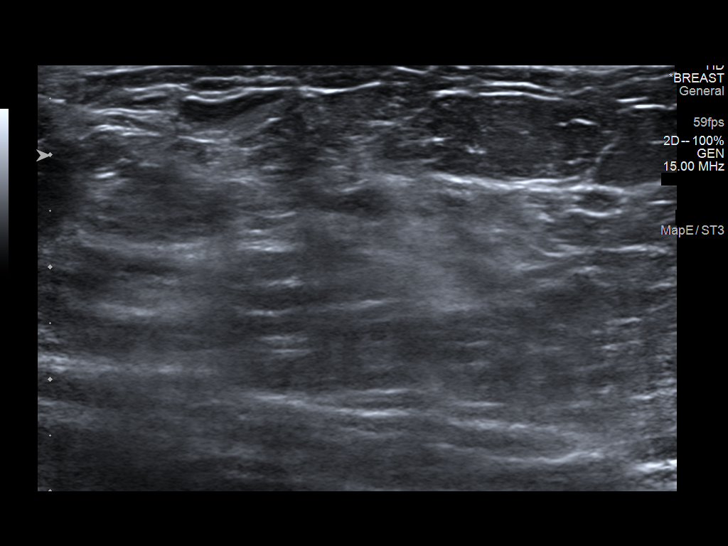
[im 8/10]
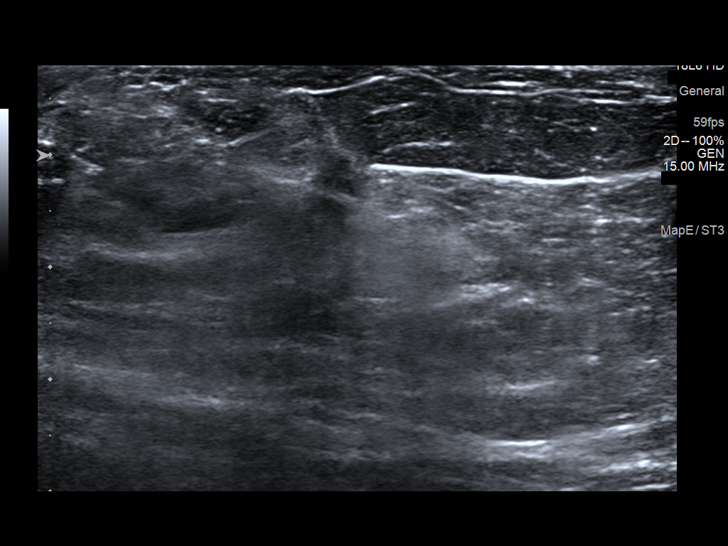
[im 9/10]
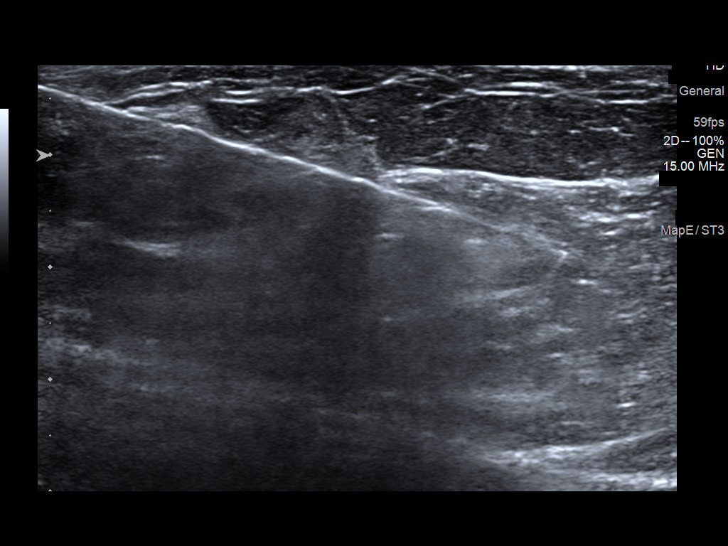
[im 10/10]
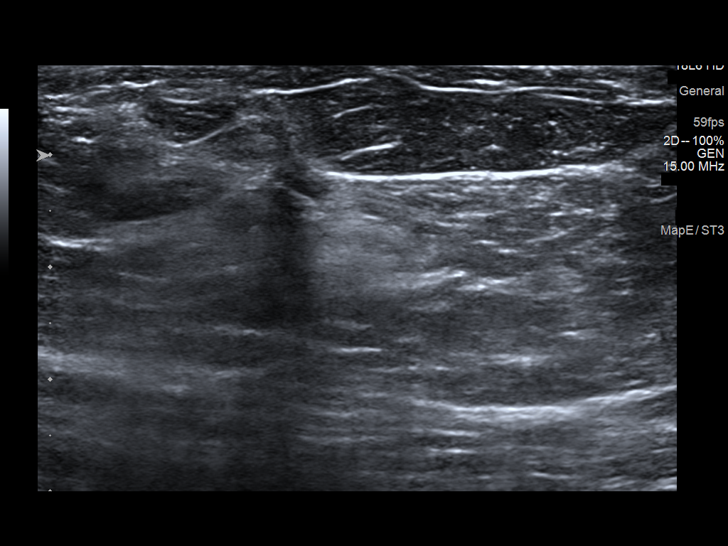

[10 of 10 positions shown; findings below may reference images not displayed]



Lesion quadrant: Upper outer quadrant

Using sterile technique and 1% Lidocaine as local anesthetic, under
direct ultrasound visualization, a 14 gauge Auad device was
used to perform biopsy of left breast mass 2 o'clock position using
a lateral approach. At the conclusion of the procedure ribbon tissue
marker clip was deployed into the biopsy cavity. Follow up 2 view
mammogram was performed and dictated separately.
IMPRESSION: Ultrasound guided biopsy of left breast mass 2 o'clock position. No
apparent complications.

ADDENDUM:
Pathology revealed GRADE II INVASIVE DUCTAL CARCINOMA of the LEFT
breast, 2 o'clock (ribbon clip). This was found to be concordant by
Dr. Pramila Curiel.

Pathology results were discussed with the patient by telephone. The
patient reported doing well after the biopsy with tenderness at the
site. Post biopsy instructions and care were reviewed and questions
were answered. The patient was encouraged to call The [REDACTED]

Per patent request, report called to [REDACTED] (Dr. Pozitivit Sophio) to make surgical referral to [REDACTED] [REDACTED] Oncology. Report and
recommendation called to Queo Tesaguic CMA on 06/27/2021.

Pathology results reported by Pou Lin Traineau RN on 06/28/2021.



Lesion quadrant: Upper outer quadrant

Using sterile technique and 1% Lidocaine as local anesthetic, under
direct ultrasound visualization, a 14 gauge Lorraine Jim device was
used to perform biopsy of left breast mass 2 o'clock position using
a lateral approach. At the conclusion of the procedure ribbon tissue
marker clip was deployed into the biopsy cavity. Follow up 2 view
mammogram was performed and dictated separately.
IMPRESSION: Ultrasound guided biopsy of left breast mass 2 o'clock position. No
apparent complications.
